# Patient Record
Sex: Male | Born: 1985 | Hispanic: Yes | Marital: Single | Smoking: Never smoker
Health system: Southern US, Community
[De-identification: ages and names within clinical notes are randomized; demographics above are authoritative.]

---

## 2004-12-07 ENCOUNTER — Emergency Department: Payer: Self-pay | Admitting: Emergency Medicine

## 2005-03-05 ENCOUNTER — Ambulatory Visit: Payer: Self-pay | Admitting: Internal Medicine

## 2007-09-24 IMAGING — CT CT ABD-PELV W/O CM
1 of 2 series · 14 of 32 positions shown, 18 images · non-contrast
Comparison: none

REASON FOR EXAM: History of kidney obstruction, bloody urine
COMMENTS:  LMP: (Male)

[Series 2: stone · axial · 0.69mm/px · z∈[-945,-594]mm · 14 of 131 slices shown, 18 images]
[im 9/131  soft-tissue]
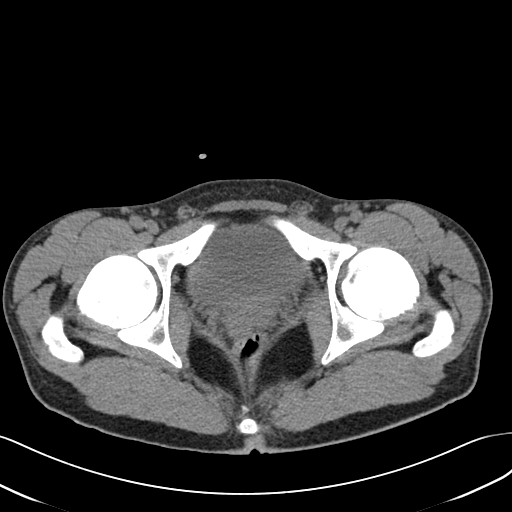
[im 9/131  bone]
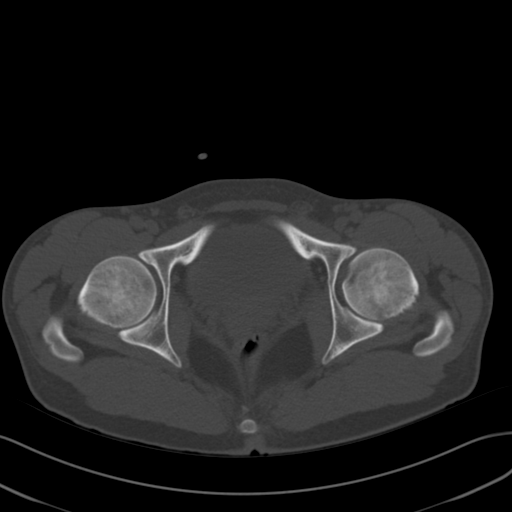
[im 18/131  soft-tissue]
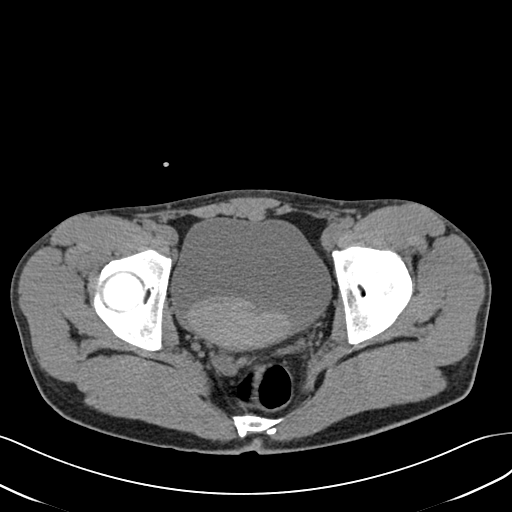
[im 27/131  soft-tissue]
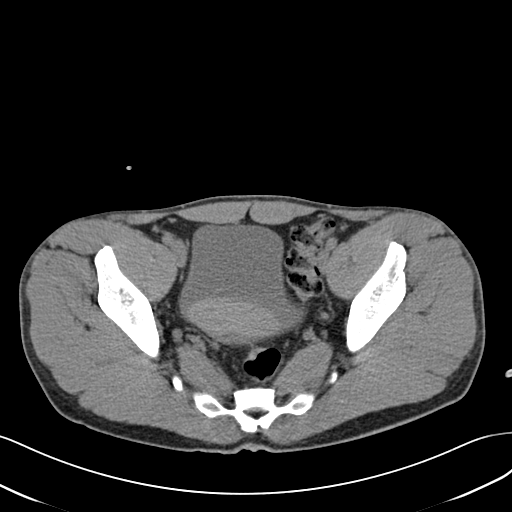
[im 41/131  soft-tissue]
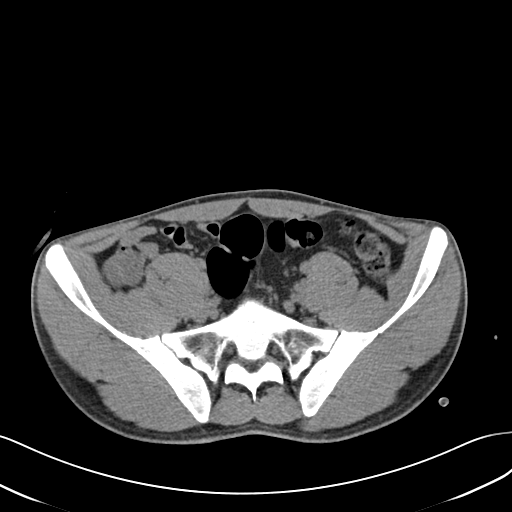
[im 50/131  soft-tissue]
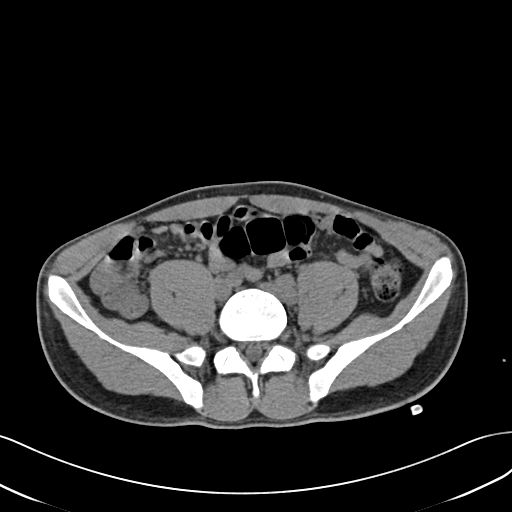
[im 59/131  soft-tissue]
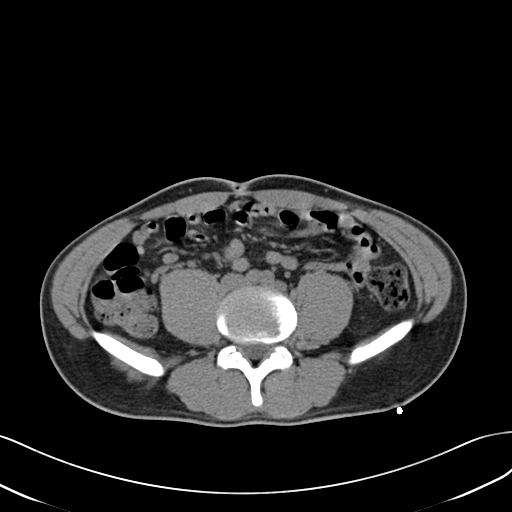
[im 72/131  soft-tissue]
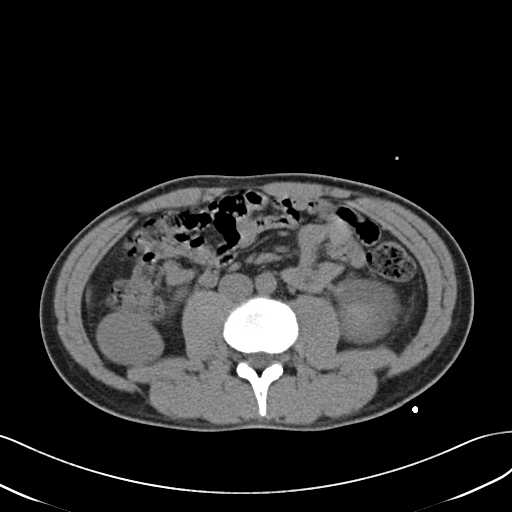
[im 81/131  soft-tissue]
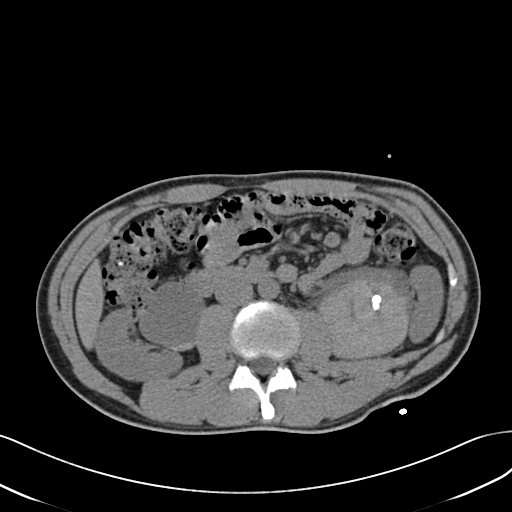
[im 90/131  soft-tissue]
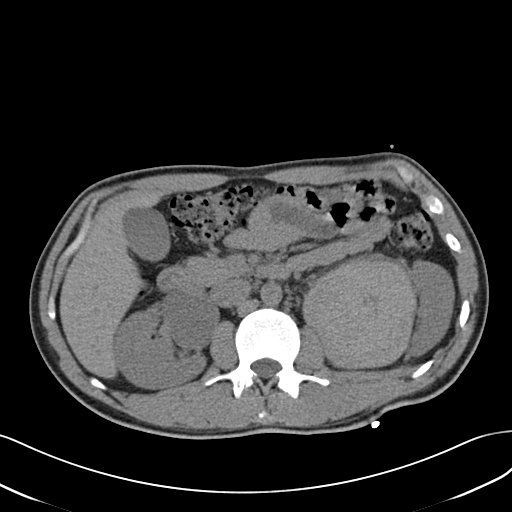
[im 90/131  bone]
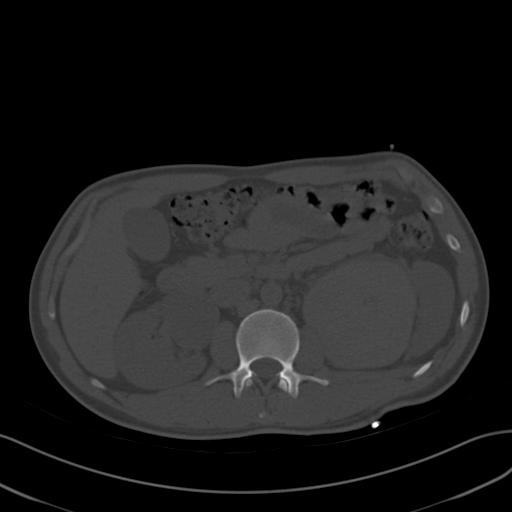
[im 104/131  soft-tissue]
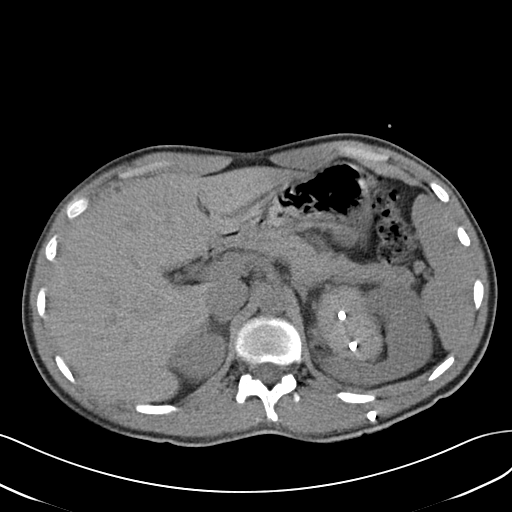
[im 113/131  soft-tissue]
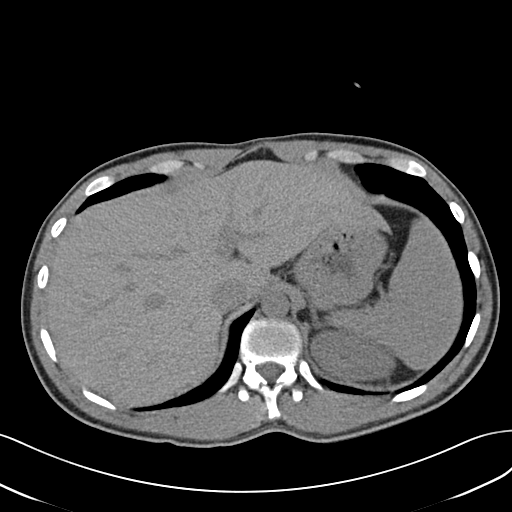
[im 113/131  lung]
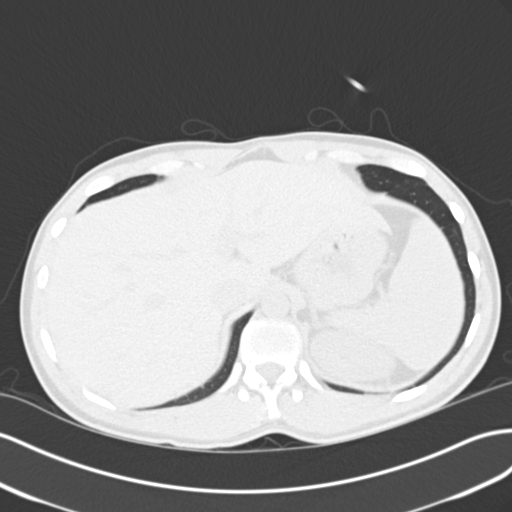
[im 117/131  lung]
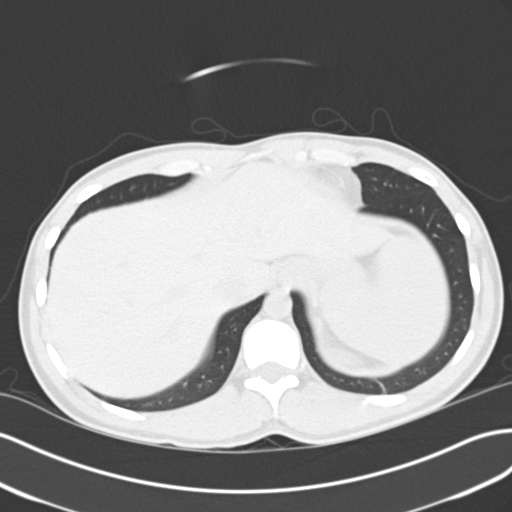
[im 122/131  soft-tissue]
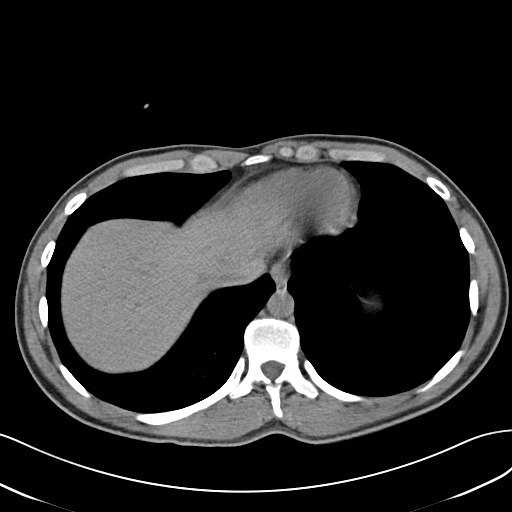
[im 122/131  lung]
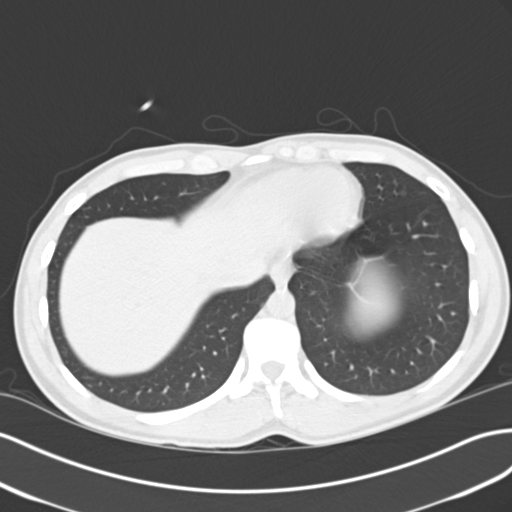
[im 126/131  lung]
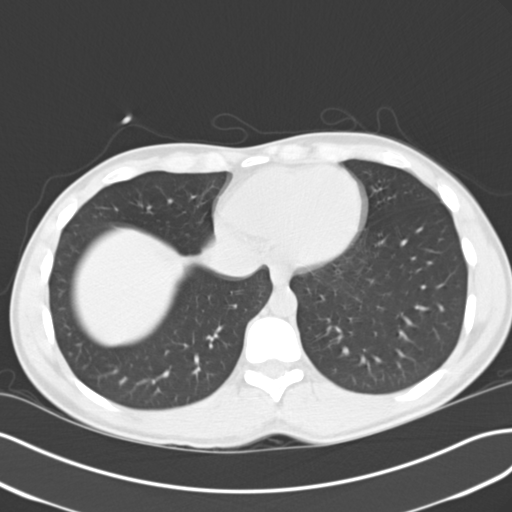

[14 of 32 positions shown; findings below may reference images not displayed]

PROCEDURE:     CT  - CT ABDOMEN AND PELVIS W[DATE]  [DATE]

RESULT:          Spiral noncontrasted 3 mm sections were obtained from the
lung bases through the pubic symphysis.

Evaluation of the lung bases demonstrates no evidence of focal infiltrates,
effusions, or edema.

Evaluation of the LEFT kidney demonstrates a percutaneous nephrostomy.  An
area of intermediate-to-high density material projects within the renal
pelvis and within the renal calices.  There is diffuse enlargement of the
pelvis, and these findings appear to be consistent with hydronephrosis,
possibly secondary to a mixture of urine and hematoma in the renal pelvis.
There does not appear to be evidence of calcified densities within the LEFT
ureter.  Evaluation of the urinary bladder demonstrates intermediate-to-high
dependent material within the base of the bladder, again likely representing
a hematoma.

Evaluation of the RIGHT kidney demonstrates a dilated renal pelvis which
appears to be dilated with urine, though within the dependent portion of the
pelvis, rounded, calcified densities project, indicative of calculi.
Calcifications also project in the region of the medullary portion of the
RIGHT kidney, likely representing medullary calculi.  These areas measure
1-2 mm in diameter.  There does not appear to be evidence of drainable
loculated fluid collections, free fluid, masses or adenopathy within the
abdomen or pelvis.

Noncontrast evaluation of the liver, spleen, adrenals, and pancreas is
unremarkable.
IMPRESSION: 1.     Moderate-to-severe hydronephrosis involving the LEFT kidney with a
mixture of high-density material within the renal pelvis and medullary
portions.  This likely represents a component of hemorrhage.  A LEFT-sided
percutaneous nephrostomy is identified with the tip projecting curled in the
region of the renal pelvis.
2.     There is mild hydronephrosis and moderate-to-severe dilatation of the
RIGHT renal pelvis.  Nonobstructing calculi are demonstrated within the
dependent portion of the renal pelvis and the medullary portion of the RIGHT
kidney.
3.     Dependent material is identified within the urinary bladder, which is
of moderate-to-high attenuation and appears to be consistent with hematoma.
No drainable loculated fluid collections are identified within the abdomen
or pelvis.
4.     A preliminary report was rendered to Dr. Merencsics Cinka, of the
emergency department, on 12/07/04 at [DATE] p.m. EST.

## 2007-12-21 IMAGING — RF DG BARIUM SWALLOW
1 series · 15 of 23 positions shown · non-contrast
Comparison: none

REASON FOR EXAM: dysphagia   GERD  call report [phone]
COMMENTS:

[Series 1: run · 6 acquisitions, 15 frames shown]
[im 1/6]
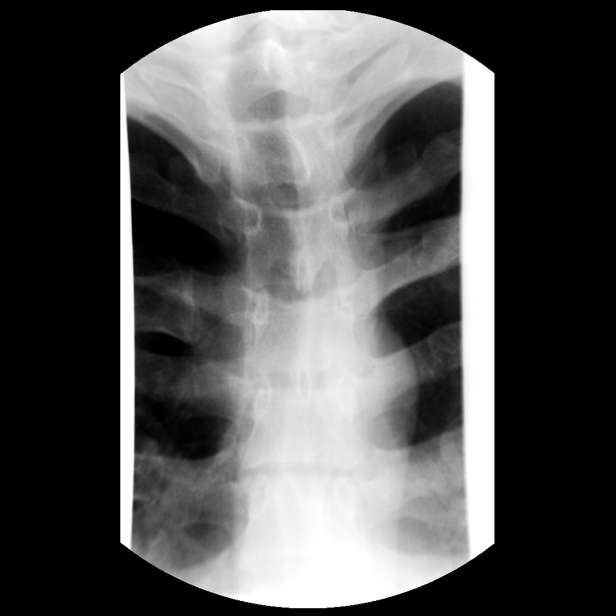
[im 1/6]
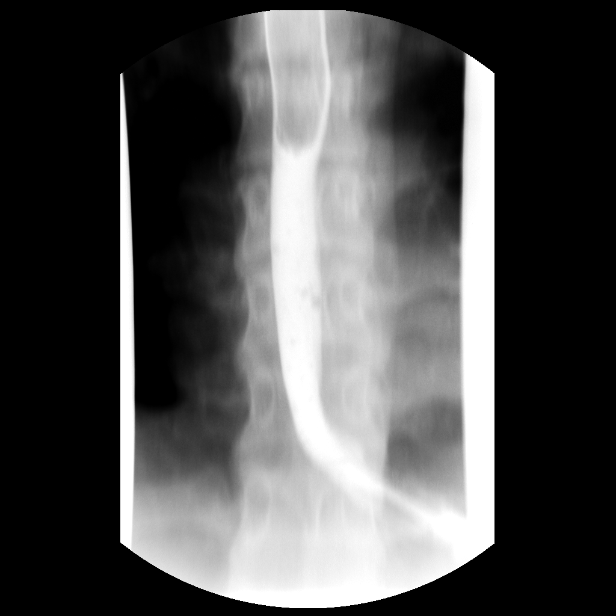
[im 1/6]
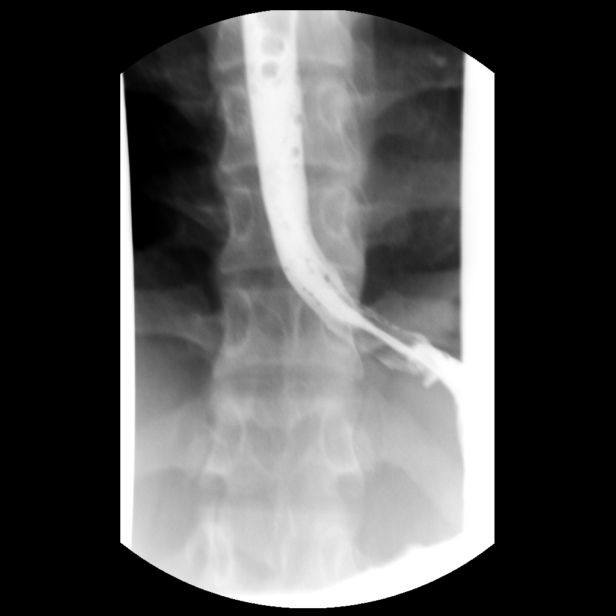
[im 2/6]
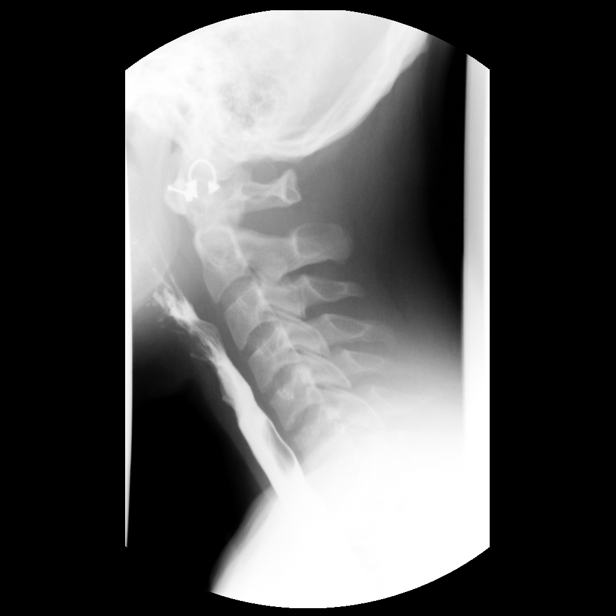
[im 2/6]
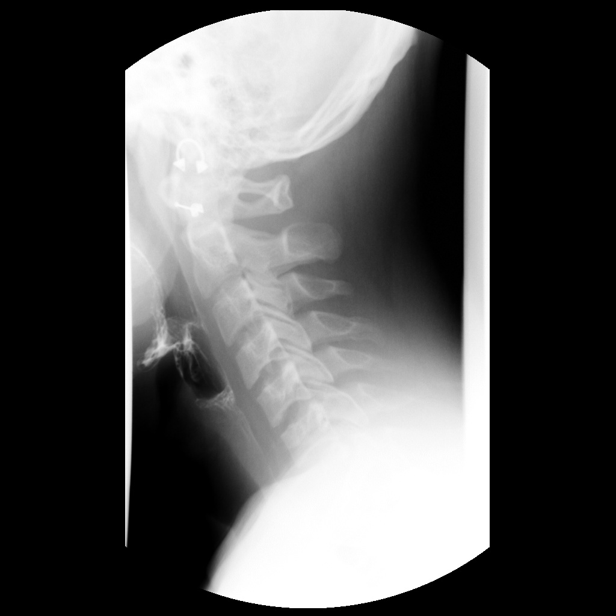
[im 3/6]
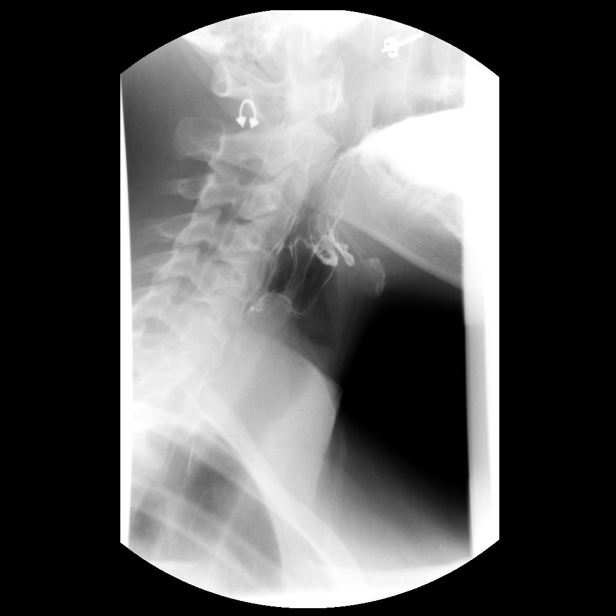
[im 3/6]
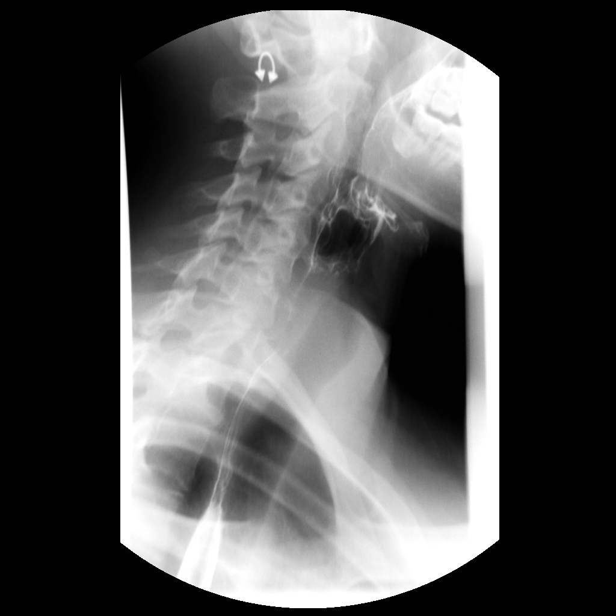
[im 3/6]
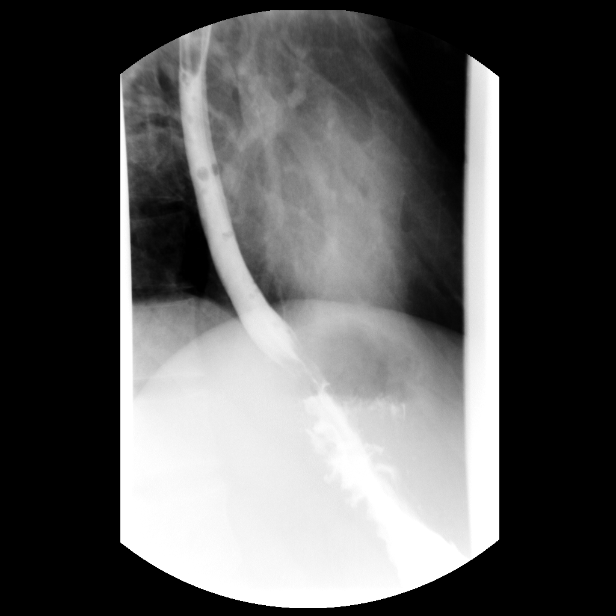
[im 4/6]
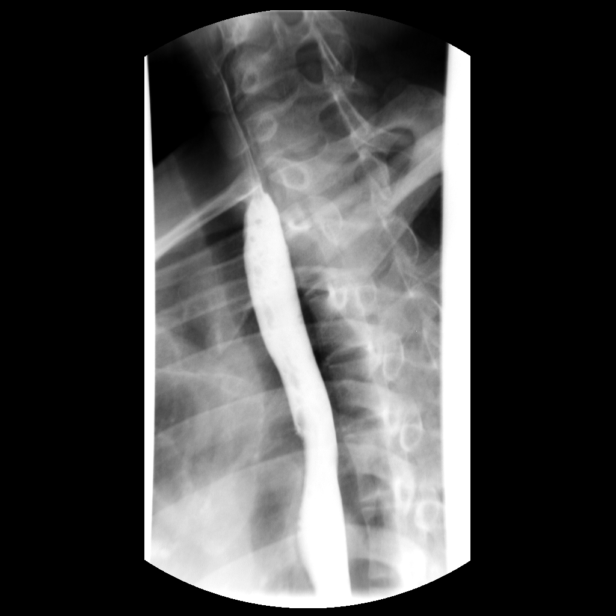
[im 4/6]
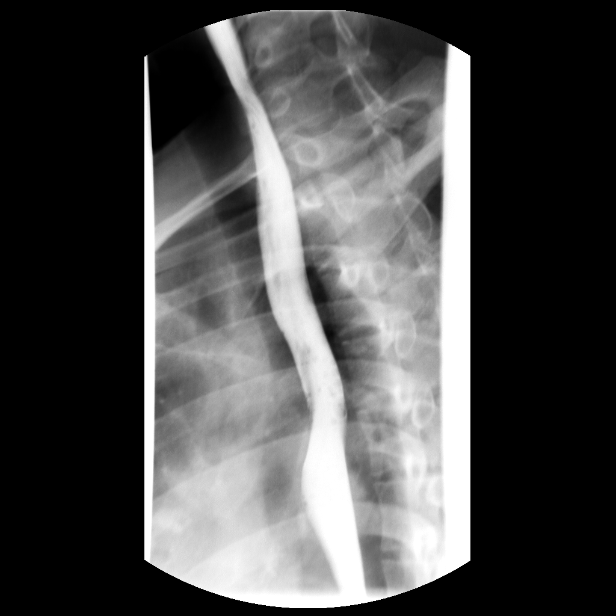
[im 5/6]
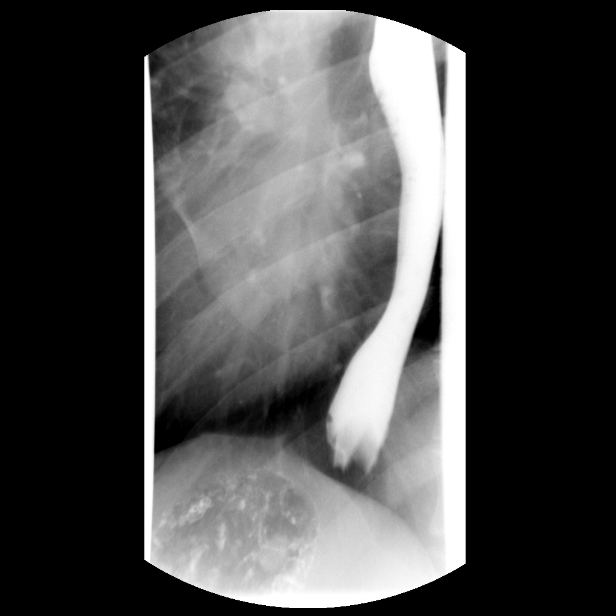
[im 5/6]
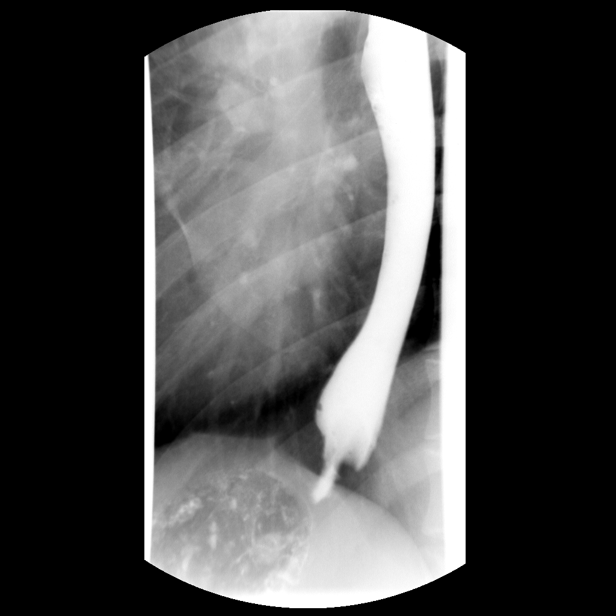
[im 6/6]
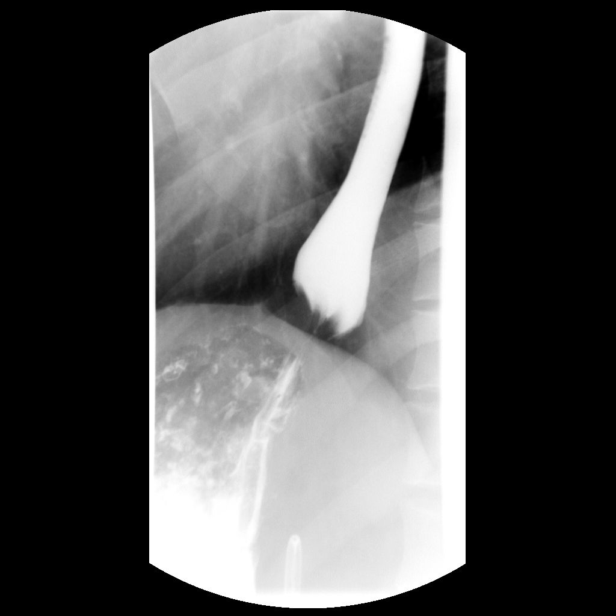
[im 6/6]
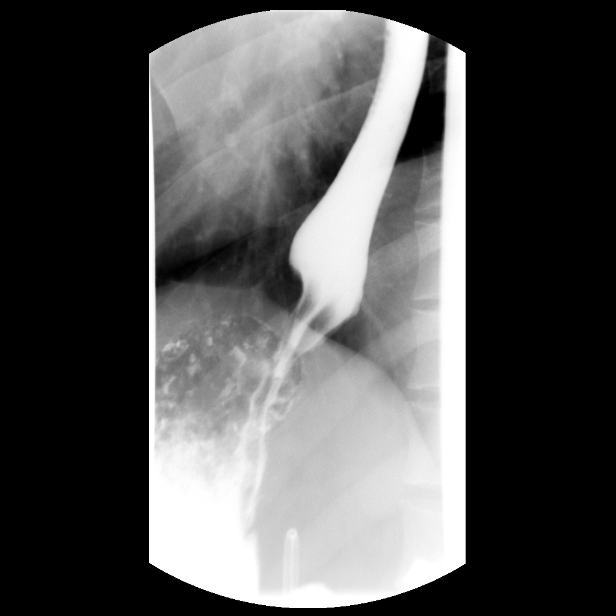
[im 6/6]
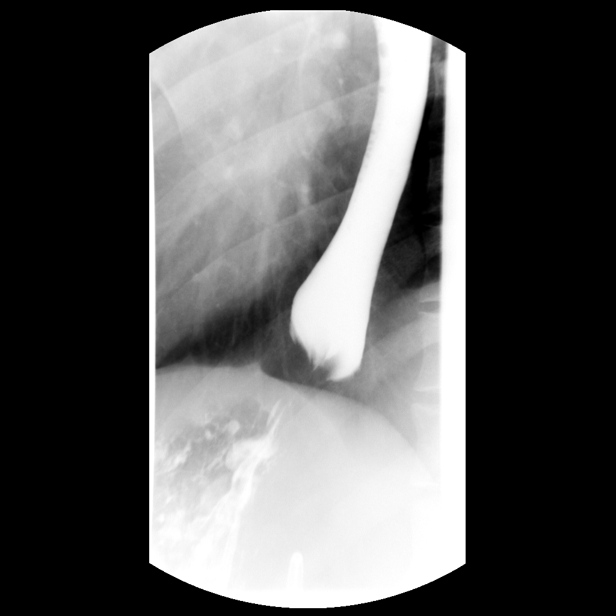

[15 of 23 positions shown; findings below may reference images not displayed]

PROCEDURE:     FL  - FL BARIUM SWALLOW  - March 05, 2005  [DATE]

RESULT:     Routine barium swallow examination shows normal appearing
swallowing function.  The esophageal mucosa and morphology appear to be
normal.  There is no stenosis or evidence of a diverticulum.  No hiatal
hernia or gastroesophageal reflux could be demonstrated.  A 12.5 mm barium
impregnated tablet passed easily through the esophagus through the stomach.
IMPRESSION: Please see above.

## 2019-05-07 ENCOUNTER — Ambulatory Visit: Payer: Self-pay | Attending: Internal Medicine

## 2019-05-07 DIAGNOSIS — Z23 Encounter for immunization: Secondary | ICD-10-CM

## 2019-05-07 NOTE — Progress Notes (Signed)
   Covid-19 Vaccination Clinic  Name:  Daniel Hicks    MRN: [id number] DOB: Jun 05, 1985  05/07/2019  Mr. Daniel Hicks was observed post Covid-19 immunization for 15 minutes without incident. He was provided with Vaccine Information Sheet and instruction to access the V-Safe system.   Mr. Daniel Hicks was instructed to call 911 with any severe reactions post vaccine: Marland Kitchen Difficulty breathing  . Swelling of face and throat  . A fast heartbeat  . A bad rash all over body  . Dizziness and weakness   Immunizations Administered    Name Date Dose VIS Date Route   Pfizer COVID-19 Vaccine 05/07/2019 11:47 AM 0.3 mL 03/09/2018 Intramuscular   Manufacturer: [employer], [employer]   Lot: [id number]   NDC: [address]-8

## 2019-06-04 ENCOUNTER — Ambulatory Visit: Payer: Self-pay | Attending: Internal Medicine

## 2019-06-04 DIAGNOSIS — Z23 Encounter for immunization: Secondary | ICD-10-CM

## 2019-06-04 NOTE — Progress Notes (Signed)
° °  Covid-19 Vaccination Clinic  Name:  Taariq Leitz    MRN: [id number] DOB: 06/13/1985  06/04/2019  Mr. Ahmari Duerson was observed post Covid-19 immunization for 15 minutes without incident. He was provided with Vaccine Information Sheet and instruction to access the V-Safe system.   Mr. Knute Mazzuca was instructed to call 911 with any severe reactions post vaccine:  Difficulty breathing   Swelling of face and throat   A fast heartbeat   A bad rash all over body   Dizziness and weakness   Immunizations Administered    Name Date Dose VIS Date Route   Pfizer COVID-19 Vaccine 06/04/2019  9:33 AM 0.3 mL 03/09/2018 Intramuscular   Manufacturer: [employer], [employer]   Lot: [id number]   NDC: [address]-0

## 2022-11-22 ENCOUNTER — Ambulatory Visit: Payer: Medicaid Other

## 2022-11-22 DIAGNOSIS — Z23 Encounter for immunization: Secondary | ICD-10-CM

## 2023-03-27 DIAGNOSIS — R300 Dysuria: Secondary | ICD-10-CM | POA: Diagnosis not present

## 2023-03-27 DIAGNOSIS — N200 Calculus of kidney: Secondary | ICD-10-CM | POA: Diagnosis not present

## 2023-05-18 ENCOUNTER — Encounter: Payer: Self-pay | Admitting: Emergency Medicine

## 2023-05-18 ENCOUNTER — Emergency Department
Admission: EM | Admit: 2023-05-18 | Discharge: 2023-05-18 | Disposition: A | Discharge status: Home / Self Care | Attending: Emergency Medicine | Admitting: Emergency Medicine

## 2023-05-18 ENCOUNTER — Other Ambulatory Visit: Payer: Self-pay

## 2023-05-18 ENCOUNTER — Emergency Department

## 2023-05-18 DIAGNOSIS — N132 Hydronephrosis with renal and ureteral calculous obstruction: Secondary | ICD-10-CM | POA: Diagnosis not present

## 2023-05-18 DIAGNOSIS — R1032 Left lower quadrant pain: Secondary | ICD-10-CM | POA: Diagnosis not present

## 2023-05-18 DIAGNOSIS — N131 Hydronephrosis with ureteral stricture, not elsewhere classified: Secondary | ICD-10-CM | POA: Diagnosis not present

## 2023-05-18 DIAGNOSIS — R109 Unspecified abdominal pain: Secondary | ICD-10-CM

## 2023-05-18 LAB — COMPREHENSIVE METABOLIC PANEL WITH GFR
ALT: 31 U/L (ref 0–44)
AST: 24 U/L (ref 15–41)
Albumin: 4.7 g/dL (ref 3.5–5.0)
Alkaline Phosphatase: 42 U/L (ref 38–126)
Anion gap: 10 (ref 5–15)
BUN: 13 mg/dL (ref 6–20)
CO2: 26 mmol/L (ref 22–32)
Calcium: 9.2 mg/dL (ref 8.9–10.3)
Chloride: 102 mmol/L (ref 98–111)
Creatinine, Ser: 0.76 mg/dL (ref 0.61–1.24)
GFR, Estimated: >60 mL/min (ref >60)
Glucose, Bld: 99 mg/dL (ref 70–99)
Potassium: 4.1 mmol/L (ref 3.5–5.1)
Sodium: 138 mmol/L (ref 135–145)
Total Bilirubin: 0.9 mg/dL (ref 0.0–1.2)
Total Protein: 7.6 g/dL (ref 6.5–8.1)

## 2023-05-18 LAB — CBC
HCT: 47 % (ref 39.0–52.0)
Hemoglobin: 16.3 g/dL (ref 13.0–17.0)
MCH: 31.8 pg (ref 26.0–34.0)
MCHC: 34.7 g/dL (ref 30.0–36.0)
MCV: 91.8 fL (ref 80.0–100.0)
Platelets: 249 10*3/uL (ref 150–400)
RBC: 5.12 MIL/uL (ref 4.22–5.81)
RDW: 12.1 % (ref 11.5–15.5)
WBC: 7.5 10*3/uL (ref 4.0–10.5)
nRBC: 0 % (ref 0.0–0.2)

## 2023-05-18 LAB — URINALYSIS, W/ REFLEX TO CULTURE (INFECTION SUSPECTED)
Bacteria, UA: NONE SEEN
Bilirubin Urine: NEGATIVE
Glucose, UA: NEGATIVE mg/dL
Ketones, ur: NEGATIVE mg/dL
Leukocytes,Ua: NEGATIVE
Nitrite: NEGATIVE
Protein, ur: 100 mg/dL — AB
RBC / HPF: >50 RBC/hpf (ref 0–5)
Specific Gravity, Urine: 1.017 (ref 1.005–1.030)
pH: 7 (ref 5.0–8.0)

## 2023-05-18 LAB — LIPASE, BLOOD: Lipase: 28 U/L (ref 11–51)

## 2023-05-18 MED ORDER — CEFDINIR 300 MG PO CAPS: 300.0000 mg | ORAL_CAPSULE | Freq: Two times a day (BID) | ORAL | 0 refills | Status: AC

## 2023-05-18 MED ORDER — MORPHINE SULFATE (PF) 4 MG/ML IV SOLN
4.0000 mg | Freq: Once | INTRAVENOUS | Status: AC
  Administered 2023-05-18: 4 mg via INTRAVENOUS
  Filled 2023-05-18: qty 1

## 2023-05-18 MED ORDER — KETOROLAC TROMETHAMINE 15 MG/ML IJ SOLN
15.0000 mg | Freq: Once | INTRAMUSCULAR | Status: AC
  Administered 2023-05-18: 15 mg via INTRAVENOUS
  Filled 2023-05-18: qty 1

## 2023-05-18 MED ORDER — OXYCODONE-ACETAMINOPHEN 5-325 MG PO TABS: 1.0000 | ORAL_TABLET | ORAL | 0 refills | Status: AC | PRN

## 2023-05-18 MED ORDER — IOHEXOL 300 MG/ML  SOLN
100.0000 mL | Freq: Once | INTRAMUSCULAR | Status: AC | PRN
  Administered 2023-05-18: 100 mL via INTRAVENOUS

## 2023-05-18 MED ORDER — ONDANSETRON HCL 4 MG/2ML IJ SOLN
4.0000 mg | Freq: Once | INTRAMUSCULAR | Status: AC
  Administered 2023-05-18: 4 mg via INTRAVENOUS
  Filled 2023-05-18: qty 2

## 2023-05-18 MED ORDER — SODIUM CHLORIDE 0.9 % IV BOLUS
500.0000 mL | Freq: Once | INTRAVENOUS | Status: AC
  Administered 2023-05-18: 500 mL via INTRAVENOUS

## 2023-05-18 NOTE — ED Provider Notes (Signed)
 [facility] Provider Note    Event Date/Time   First MD Initiated Contact with Patient 05/18/23 1031     (approximate)   History   Flank Pain  Formal translator service utilized HPI  Daniel Hicks is a 38 y.o. male past medical history significant for prior renal surgery, who presents to the emergency department with flank pain.  Endorses 1 week of left-sided flank pain that has been intermittent.  Sharp stabbing pain that comes and goes.  States that it is worse with movement and bending over.  Associated with nausea and vomiting.  Denies any falls or trauma.  Does endorse intermittent episodes of dysuria but no blood in his stool.  No formal diagnosis or history of kidney stones.  1 prior episode approximately 1 month ago and was evaluated urgent care and told that he might have kidney stones.  States that he remotely had a ureteral procedure done when he was a child but does not know the details of the surgery.     Physical Exam   Triage Vital Signs: ED Triage Vitals  Encounter Vitals Group     BP 05/18/23 0958 (!) 140/86     Systolic BP Percentile --      Diastolic BP Percentile --      Pulse Rate 05/18/23 0958 (!) 101     Resp 05/18/23 0958 17     Temp 05/18/23 0958 98 F (36.7 C)     Temp Source 05/18/23 0958 Oral     SpO2 05/18/23 0958 99 %     Weight 05/18/23 0958 200 lb (90.7 kg)     Height 05/18/23 0958 6' (1.829 m)     Head Circumference --      Peak Flow --      Pain Score 05/18/23 1003 8     Pain Loc --      Pain Education --      Exclude from Growth Chart --     Most recent vital signs: Vitals:   05/18/23 0958 05/18/23 1432  BP: (!) 140/86 138/80  Pulse: (!) 101 88  Resp: 17 16  Temp: 98 F (36.7 C) 98 F (36.7 C)  SpO2: 99% 99%    Physical Exam Constitutional:      Appearance: He is well-developed.  HENT:     Head: Atraumatic.  Eyes:     Conjunctiva/sclera: Conjunctivae normal.  Cardiovascular:     Rate and  Rhythm: Regular rhythm.  Pulmonary:     Effort: No respiratory distress.  Abdominal:     Tenderness: There is abdominal tenderness (Left lower quadrant tenderness to palpation without rebound or guarding). There is left CVA tenderness. There is no right CVA tenderness.  Musculoskeletal:        General: Normal range of motion.     Cervical back: Normal range of motion.     Right lower leg: No edema.     Left lower leg: No edema.  Skin:    General: Skin is warm.     Capillary Refill: Capillary refill takes less than 2 seconds.  Neurological:     Mental Status: He is alert. Mental status is at baseline.     IMPRESSION / MDM / ASSESSMENT AND PLAN / ED COURSE  I reviewed the triage vital signs and the nursing notes.  Differential diagnosis including kidney stone, pyelonephritis, intra-abdominal abscess, diverticulitis, musculoskeletal strain.  Clinical picture is not consistent with cauda equina or epidural compression syndrome.  EKG  Nereida Banning, the attending physician, personally viewed and interpreted this ECG.   Rate: Normal  Rhythm: Normal sinus  Axis: Normal  Intervals: Normal  ST&T Change: None  No tachycardic or bradycardic dysrhythmias while on cardiac telemetry.  RADIOLOGY I independently reviewed imaging, my interpretation of imaging: CT scan abdomen and pelvis with contrast significant left-sided hydronephrosis.  Read as chronic hydronephrosis with nonobstructing kidney stone.  Likely secondary to a stricture.  LABS (all labs ordered are listed, but only abnormal results are displayed) Labs interpreted as -    Labs Reviewed  URINALYSIS, W/ REFLEX TO CULTURE (INFECTION SUSPECTED) - Abnormal; Notable for the following components:      Result Value   Color, Urine YELLOW (*)    APPearance CLEAR (*)    Hgb urine dipstick MODERATE (*)    Protein, ur 100 (*)    All other components within normal limits  CBC  COMPREHENSIVE METABOLIC PANEL WITH GFR  LIPASE,  BLOOD     MDM  Plan for lab work and a urine test.  Will give IV fluids, IV ketorolac and Zofran.  Will obtain CT scan abdomen and pelvis with contrast  Lab work reassuring.  Creatinine appears to be at his baseline.  No signs of urinary tract infection or pyelonephritis.  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  Consulted Dr. Ace Holder with urology for further recommendations.  Continued to have pain after IV ketorolac and given IV morphine which resolved his pain.  Care transferred to incoming provider.     PROCEDURES:  Critical Care performed: No  Procedures  Patient's presentation is most consistent with acute presentation with potential threat to life or bodily function.   MEDICATIONS ORDERED IN ED: Medications  ketorolac (TORADOL) 15 MG/ML injection 15 mg (15 mg Intravenous Given 05/18/23 1136)  ondansetron (ZOFRAN) injection 4 mg (4 mg Intravenous Given 05/18/23 1136)  sodium chloride 0.9 % bolus 500 mL (500 mLs Intravenous New Bag/Given 05/18/23 1135)  iohexol (OMNIPAQUE) 300 MG/ML solution 100 mL (100 mLs Intravenous Contrast Given 05/18/23 1239)  morphine (PF) 4 MG/ML injection 4 mg (4 mg Intravenous Given 05/18/23 1446)    FINAL CLINICAL IMPRESSION(S) / ED DIAGNOSES   Final diagnoses:  Hydronephrosis with ureteral stricture, not elsewhere classified     Rx / DC Orders   ED Discharge Orders          Ordered    oxyCODONE-acetaminophen (PERCOCET) 5-325 MG tablet  Every 4 hours PRN        05/18/23 1518             Note:  This document was prepared using Dragon voice recognition software and may include unintentional dictation errors.   Viviano Ground, MD 05/18/23 [phone]

## 2023-05-18 NOTE — ED Notes (Signed)
 See triage note  Presents with intermittent pain to left side of abd  States pain started 1 week ago  Afebrile on arrival denies any n/v/d

## 2023-05-18 NOTE — ED Provider Notes (Signed)
.-----------------------------------------   3:05 PM on 05/18/2023 -----------------------------------------  Blood pressure 138/80, pulse 88, temperature 98 F (36.7 C), resp. rate 16, height 6' (1.829 m), weight 90.7 kg, SpO2 99%.  Assuming care from Dr. Dodson Freestone.  In short, Daniel Hicks is a 38 y.o. male with a chief complaint of Flank Pain .  Refer to the original H&P for additional details.  The current plan of care is to follow up urology recs.  On reassessment pain is controlled, he denies any dysuria today, no fever at home.  Consulted urology who recommended sending him some antibiotics that can hold in case he starts having dysuria and/or fever.  Shared decision making done with patient and he is agreeable with plan for outpatient management, gave him the number to call for urology to follow-up.  Considered but no indication for inpatient admission at this time, he is safe for outpatient management.  Independent review of labs and imaging are below.  Discharged with strict return precautions.  Spanish translator was used for encounter  Clinical Course as of 05/18/23 1613  Mon May 18, 2023  1603 Independent review of labs, no leukocytosis, electrolytes not severely deranged, LFTs and lipase are normal, UA is negative for nitrates, leuk esterase, bacteria, 0-5 WBCs, more than 50 RBCs were present. [TT]  [id number] Consulted urology who looked at the images as well as labs, says low threshold to give him antibiotics, recommended that since he has no tachycardia or findings on UA, to give him around antibiotics taking cold in case he has dysuria again or starts having low-grade temperatures, will have him follow-up outpatient with them. [TT]    Clinical Course User Index [TT] Shane Darling, MD      Shane Darling, MD 05/18/23 [phone]

## 2023-05-18 NOTE — ED Triage Notes (Signed)
 Triage completed via Spanish interpreter [facility]  Pt arrived via POV with c/o L flank pain x 1 week, pt reports increased pain. Pt reports he has hx of the same pain about 1 month ago.  Denies any difficulty with urination, but states he has been urinating more.

## 2023-05-18 NOTE — Discharge Instructions (Addendum)
 I am prescribing you some antibiotics that you can start taking if you start having dysuria again or a fever of 100.4 F or above.  Please make sure to follow-up with urology for further management of your symptoms.

## 2023-05-19 ENCOUNTER — Telehealth: Payer: Self-pay | Admitting: Urology

## 2023-05-19 NOTE — Telephone Encounter (Signed)
 Selene w/[facility] called to schedule an ER follow up for pt.  Per Dr. Mindi Alto message:   Not urgent- any provider 1-2 weeks, pt's follow up is not urgent.    Selene's phone# [phone] ext [phone]

## 2023-05-21 DIAGNOSIS — N200 Calculus of kidney: Secondary | ICD-10-CM | POA: Diagnosis not present

## 2023-05-21 DIAGNOSIS — N130 Hydronephrosis with ureteropelvic junction obstruction: Secondary | ICD-10-CM | POA: Diagnosis not present

## 2023-05-21 DIAGNOSIS — N132 Hydronephrosis with renal and ureteral calculous obstruction: Secondary | ICD-10-CM | POA: Diagnosis not present

## 2023-07-10 DIAGNOSIS — N200 Calculus of kidney: Secondary | ICD-10-CM | POA: Diagnosis not present
